# Patient Record
Sex: Male | Born: 1970 | Race: White | Hispanic: No | Marital: Married | Smoking: Never smoker
Health system: Southern US, Community
[De-identification: ages and names within clinical notes are randomized; demographics above are authoritative.]

---

## 2017-12-08 HISTORY — PX: PARTIAL COLECTOMY: SHX5273

## 2021-04-25 ENCOUNTER — Emergency Department (HOSPITAL_COMMUNITY): Payer: No Typology Code available for payment source

## 2021-04-25 ENCOUNTER — Inpatient Hospital Stay (HOSPITAL_COMMUNITY)
Admission: EM | Admit: 2021-04-25 | Discharge: 2021-04-28 | DRG: 394 | Disposition: A | Discharge status: Home / Self Care | Payer: No Typology Code available for payment source | Attending: Internal Medicine | Admitting: Internal Medicine

## 2021-04-25 ENCOUNTER — Other Ambulatory Visit: Payer: Self-pay

## 2021-04-25 ENCOUNTER — Encounter (HOSPITAL_COMMUNITY): Payer: Self-pay | Admitting: Emergency Medicine

## 2021-04-25 DIAGNOSIS — K529 Noninfective gastroenteritis and colitis, unspecified: Principal | ICD-10-CM | POA: Diagnosis present

## 2021-04-25 DIAGNOSIS — Z20822 Contact with and (suspected) exposure to covid-19: Secondary | ICD-10-CM | POA: Diagnosis present

## 2021-04-25 DIAGNOSIS — E871 Hypo-osmolality and hyponatremia: Secondary | ICD-10-CM | POA: Diagnosis present

## 2021-04-25 DIAGNOSIS — K760 Fatty (change of) liver, not elsewhere classified: Secondary | ICD-10-CM | POA: Diagnosis present

## 2021-04-25 DIAGNOSIS — Z9049 Acquired absence of other specified parts of digestive tract: Secondary | ICD-10-CM

## 2021-04-25 DIAGNOSIS — Z6828 Body mass index (BMI) 28.0-28.9, adult: Secondary | ICD-10-CM

## 2021-04-25 DIAGNOSIS — N289 Disorder of kidney and ureter, unspecified: Secondary | ICD-10-CM

## 2021-04-25 DIAGNOSIS — R7989 Other specified abnormal findings of blood chemistry: Secondary | ICD-10-CM | POA: Diagnosis present

## 2021-04-25 DIAGNOSIS — K559 Vascular disorder of intestine, unspecified: Secondary | ICD-10-CM | POA: Diagnosis not present

## 2021-04-25 DIAGNOSIS — E875 Hyperkalemia: Secondary | ICD-10-CM | POA: Diagnosis present

## 2021-04-25 DIAGNOSIS — E663 Overweight: Secondary | ICD-10-CM | POA: Diagnosis present

## 2021-04-25 DIAGNOSIS — N179 Acute kidney failure, unspecified: Secondary | ICD-10-CM | POA: Diagnosis present

## 2021-04-25 DIAGNOSIS — R7401 Elevation of levels of liver transaminase levels: Secondary | ICD-10-CM

## 2021-04-25 DIAGNOSIS — Z720 Tobacco use: Secondary | ICD-10-CM

## 2021-04-25 LAB — CBC WITH DIFFERENTIAL/PLATELET
Abs Immature Granulocytes: 0.05 10*3/uL (ref 0.00–0.07)
Basophils Absolute: 0 10*3/uL (ref 0.0–0.1)
Basophils Relative: 0 %
Eosinophils Absolute: 0 10*3/uL (ref 0.0–0.5)
Eosinophils Relative: 0 %
HCT: 49.1 % (ref 39.0–52.0)
Hemoglobin: 16.4 g/dL (ref 13.0–17.0)
Immature Granulocytes: 0 %
Lymphocytes Relative: 16 %
Lymphs Abs: 2 10*3/uL (ref 0.7–4.0)
MCH: 30.5 pg (ref 26.0–34.0)
MCHC: 33.4 g/dL (ref 30.0–36.0)
MCV: 91.3 fL (ref 80.0–100.0)
Monocytes Absolute: 1.1 10*3/uL — ABNORMAL HIGH (ref 0.1–1.0)
Monocytes Relative: 9 %
Neutro Abs: 8.9 10*3/uL — ABNORMAL HIGH (ref 1.7–7.7)
Neutrophils Relative %: 75 %
Platelets: 255 10*3/uL (ref 150–400)
RBC: 5.38 MIL/uL (ref 4.22–5.81)
RDW: 13.1 % (ref 11.5–15.5)
WBC: 12.1 10*3/uL — ABNORMAL HIGH (ref 4.0–10.5)
nRBC: 0 % (ref 0.0–0.2)

## 2021-04-25 LAB — URINALYSIS, ROUTINE W REFLEX MICROSCOPIC
Bacteria, UA: NONE SEEN
Bilirubin Urine: NEGATIVE
Glucose, UA: NEGATIVE mg/dL
Ketones, ur: 80 mg/dL — AB
Leukocytes,Ua: NEGATIVE
Nitrite: NEGATIVE
Protein, ur: NEGATIVE mg/dL
Specific Gravity, Urine: 1.021 (ref 1.005–1.030)
pH: 5 (ref 5.0–8.0)

## 2021-04-25 MED ORDER — ONDANSETRON HCL 4 MG/2ML IJ SOLN
4.0000 mg | Freq: Once | INTRAMUSCULAR | Status: AC
  Administered 2021-04-25: 4 mg via INTRAVENOUS
  Filled 2021-04-25: qty 2

## 2021-04-25 MED ORDER — FENTANYL CITRATE (PF) 100 MCG/2ML IJ SOLN
50.0000 ug | Freq: Once | INTRAMUSCULAR | Status: AC
Start: 2021-04-25 — End: 2021-04-25
  Administered 2021-04-25: 50 ug via INTRAVENOUS
  Filled 2021-04-25: qty 2

## 2021-04-25 NOTE — ED Provider Notes (Signed)
MSE was initiated and I personally evaluated the patient and placed orders (if any) at  10:38 PM on Apr 25, 2021.  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 Also reports passing BRB per rectum x 5 today. Nausea, no vomiting. He reports diaphoresis, ?fever. No urinary symptoms.   Today's Vitals   04/25/21 2227 04/25/21 2234  BP: (!) 155/102   Pulse: 78   Resp: 20   Temp: 98.5 F (36.9 C)   TempSrc: Oral   SpO2: 99%   PainSc:  8    There is no height or weight on file to calculate BMI.  Uncomfortable appearing Abdomen extremely tender distended  The patient appears stable so that the remainder of the MSE may be completed by another provider.   Elpidio Anis, PA-C 04/25/21 2243    Tegeler, Canary Brim, MD 04/26/21 [phone]

## 2021-04-25 NOTE — ED Triage Notes (Signed)
Patient reports hypogastric pain with nausea and diarrhea onset last night , no fever or chills .

## 2021-04-26 ENCOUNTER — Encounter (HOSPITAL_COMMUNITY): Payer: Self-pay | Admitting: Family Medicine

## 2021-04-26 DIAGNOSIS — E871 Hypo-osmolality and hyponatremia: Secondary | ICD-10-CM | POA: Diagnosis present

## 2021-04-26 DIAGNOSIS — K760 Fatty (change of) liver, not elsewhere classified: Secondary | ICD-10-CM | POA: Diagnosis present

## 2021-04-26 DIAGNOSIS — E875 Hyperkalemia: Secondary | ICD-10-CM

## 2021-04-26 DIAGNOSIS — R7989 Other specified abnormal findings of blood chemistry: Secondary | ICD-10-CM

## 2021-04-26 DIAGNOSIS — Z720 Tobacco use: Secondary | ICD-10-CM | POA: Diagnosis not present

## 2021-04-26 DIAGNOSIS — N179 Acute kidney failure, unspecified: Secondary | ICD-10-CM | POA: Diagnosis present

## 2021-04-26 DIAGNOSIS — Z20822 Contact with and (suspected) exposure to covid-19: Secondary | ICD-10-CM | POA: Diagnosis present

## 2021-04-26 DIAGNOSIS — Z6828 Body mass index (BMI) 28.0-28.9, adult: Secondary | ICD-10-CM | POA: Diagnosis not present

## 2021-04-26 DIAGNOSIS — K559 Vascular disorder of intestine, unspecified: Secondary | ICD-10-CM | POA: Diagnosis present

## 2021-04-26 DIAGNOSIS — K529 Noninfective gastroenteritis and colitis, unspecified: Secondary | ICD-10-CM | POA: Diagnosis present

## 2021-04-26 DIAGNOSIS — N289 Disorder of kidney and ureter, unspecified: Secondary | ICD-10-CM | POA: Diagnosis not present

## 2021-04-26 DIAGNOSIS — E663 Overweight: Secondary | ICD-10-CM | POA: Diagnosis present

## 2021-04-26 DIAGNOSIS — Z9049 Acquired absence of other specified parts of digestive tract: Secondary | ICD-10-CM | POA: Diagnosis not present

## 2021-04-26 LAB — COMPREHENSIVE METABOLIC PANEL
ALT: 93 U/L — ABNORMAL HIGH (ref 0–44)
ALT: 69 U/L — ABNORMAL HIGH (ref 0–44)
AST: 51 U/L — ABNORMAL HIGH (ref 15–41)
AST: 29 U/L (ref 15–41)
Albumin: 4.4 g/dL (ref 3.5–5.0)
Albumin: 3.5 g/dL (ref 3.5–5.0)
Alkaline Phosphatase: 61 U/L (ref 38–126)
Alkaline Phosphatase: 49 U/L (ref 38–126)
Anion gap: 11 (ref 5–15)
Anion gap: 6 (ref 5–15)
BUN: 12 mg/dL (ref 6–20)
BUN: 12 mg/dL (ref 6–20)
CO2: 25 mmol/L (ref 22–32)
CO2: 24 mmol/L (ref 22–32)
Calcium: 9.9 mg/dL (ref 8.9–10.3)
Calcium: 8.9 mg/dL (ref 8.9–10.3)
Chloride: 100 mmol/L (ref 98–111)
Chloride: 103 mmol/L (ref 98–111)
Creatinine, Ser: 1.54 mg/dL — ABNORMAL HIGH (ref 0.61–1.24)
Creatinine, Ser: 1.28 mg/dL — ABNORMAL HIGH (ref 0.61–1.24)
GFR, Estimated: 54 mL/min — ABNORMAL LOW (ref >60)
GFR, Estimated: >60 mL/min (ref >60)
Glucose, Bld: 108 mg/dL — ABNORMAL HIGH (ref 70–99)
Glucose, Bld: 94 mg/dL (ref 70–99)
Potassium: 5.6 mmol/L — ABNORMAL HIGH (ref 3.5–5.1)
Potassium: 4 mmol/L (ref 3.5–5.1)
Sodium: 136 mmol/L (ref 135–145)
Sodium: 133 mmol/L — ABNORMAL LOW (ref 135–145)
Total Bilirubin: 1.5 mg/dL — ABNORMAL HIGH (ref 0.3–1.2)
Total Bilirubin: 0.9 mg/dL (ref 0.3–1.2)
Total Protein: 8 g/dL (ref 6.5–8.1)
Total Protein: 6.4 g/dL — ABNORMAL LOW (ref 6.5–8.1)

## 2021-04-26 LAB — CBC
HCT: 41.9 % (ref 39.0–52.0)
HCT: 41.6 % (ref 39.0–52.0)
HCT: 42 % (ref 39.0–52.0)
Hemoglobin: 14 g/dL (ref 13.0–17.0)
Hemoglobin: 13.9 g/dL (ref 13.0–17.0)
Hemoglobin: 13.9 g/dL (ref 13.0–17.0)
MCH: 30.9 pg (ref 26.0–34.0)
MCH: 30.6 pg (ref 26.0–34.0)
MCH: 30.4 pg (ref 26.0–34.0)
MCHC: 33.4 g/dL (ref 30.0–36.0)
MCHC: 33.4 g/dL (ref 30.0–36.0)
MCHC: 33.1 g/dL (ref 30.0–36.0)
MCV: 92.5 fL (ref 80.0–100.0)
MCV: 91.6 fL (ref 80.0–100.0)
MCV: 91.9 fL (ref 80.0–100.0)
Platelets: 204 10*3/uL (ref 150–400)
Platelets: 189 10*3/uL (ref 150–400)
Platelets: 204 10*3/uL (ref 150–400)
RBC: 4.53 MIL/uL (ref 4.22–5.81)
RBC: 4.54 MIL/uL (ref 4.22–5.81)
RBC: 4.57 MIL/uL (ref 4.22–5.81)
RDW: 13.2 % (ref 11.5–15.5)
RDW: 13.2 % (ref 11.5–15.5)
RDW: 13.1 % (ref 11.5–15.5)
WBC: 8.8 10*3/uL (ref 4.0–10.5)
WBC: 8.6 10*3/uL (ref 4.0–10.5)
WBC: 9 10*3/uL (ref 4.0–10.5)
nRBC: 0 % (ref 0.0–0.2)
nRBC: 0 % (ref 0.0–0.2)
nRBC: 0 % (ref 0.0–0.2)

## 2021-04-26 LAB — LACTIC ACID, PLASMA: Lactic Acid, Venous: 1.6 mmol/L (ref 0.5–1.9)

## 2021-04-26 LAB — C-REACTIVE PROTEIN: CRP: 5.7 mg/dL — ABNORMAL HIGH (ref <1.0)

## 2021-04-26 LAB — SARS CORONAVIRUS 2 (TAT 6-24 HRS): SARS Coronavirus 2: NEGATIVE

## 2021-04-26 LAB — HIV ANTIBODY (ROUTINE TESTING W REFLEX): HIV Screen 4th Generation wRfx: NONREACTIVE

## 2021-04-26 LAB — TYPE AND SCREEN
ABO/RH(D): O NEG
Antibody Screen: NEGATIVE

## 2021-04-26 LAB — SEDIMENTATION RATE: Sed Rate: 4 mm/hr (ref 0–16)

## 2021-04-26 LAB — LIPASE, BLOOD: Lipase: 28 U/L (ref 11–51)

## 2021-04-26 LAB — ABO/RH: ABO/RH(D): O NEG

## 2021-04-26 MED ORDER — METRONIDAZOLE 500 MG/100ML IV SOLN
500.0000 mg | Freq: Once | INTRAVENOUS | Status: AC
  Administered 2021-04-26: 500 mg via INTRAVENOUS
  Filled 2021-04-26: qty 100

## 2021-04-26 MED ORDER — CIPROFLOXACIN IN D5W 400 MG/200ML IV SOLN
400.0000 mg | Freq: Once | INTRAVENOUS | Status: AC
  Administered 2021-04-26: 400 mg via INTRAVENOUS
  Filled 2021-04-26: qty 200

## 2021-04-26 MED ORDER — CIPROFLOXACIN IN D5W 400 MG/200ML IV SOLN
400.0000 mg | Freq: Two times a day (BID) | INTRAVENOUS | Status: DC
  Administered 2021-04-26 – 2021-04-28 (×5): 400 mg via INTRAVENOUS
  Filled 2021-04-26 (×6): qty 200

## 2021-04-26 MED ORDER — OXYCODONE HCL 5 MG PO TABS
5.0000 mg | ORAL_TABLET | Freq: Four times a day (QID) | ORAL | Status: DC | PRN
Start: 2021-04-26 — End: 2021-04-28
  Administered 2021-04-26 – 2021-04-28 (×3): 5 mg via ORAL
  Filled 2021-04-26 (×4): qty 1

## 2021-04-26 MED ORDER — ONDANSETRON HCL 4 MG PO TABS: 4.0000 mg | ORAL_TABLET | Freq: Four times a day (QID) | ORAL | Status: DC | PRN

## 2021-04-26 MED ORDER — HYDROMORPHONE HCL 1 MG/ML IJ SOLN
0.5000 mg | INTRAMUSCULAR | Status: DC | PRN
Start: 2021-04-26 — End: 2021-04-28
  Administered 2021-04-26 – 2021-04-27 (×5): 1 mg via INTRAVENOUS
  Filled 2021-04-26 (×7): qty 1

## 2021-04-26 MED ORDER — ZOLPIDEM TARTRATE 5 MG PO TABS
5.0000 mg | ORAL_TABLET | Freq: Every evening | ORAL | Status: DC | PRN
  Administered 2021-04-26 – 2021-04-27 (×3): 5 mg via ORAL
  Filled 2021-04-26 (×3): qty 1

## 2021-04-26 MED ORDER — MORPHINE SULFATE (PF) 4 MG/ML IV SOLN
4.0000 mg | Freq: Once | INTRAVENOUS | Status: AC
  Administered 2021-04-26: 4 mg via INTRAVENOUS
  Filled 2021-04-26: qty 1

## 2021-04-26 MED ORDER — METRONIDAZOLE 500 MG/100ML IV SOLN
500.0000 mg | Freq: Three times a day (TID) | INTRAVENOUS | Status: DC
  Administered 2021-04-26 – 2021-04-28 (×7): 500 mg via INTRAVENOUS
  Filled 2021-04-26 (×7): qty 100

## 2021-04-26 MED ORDER — LACTATED RINGERS IV SOLN: INTRAVENOUS | Status: DC

## 2021-04-26 MED ORDER — ACETAMINOPHEN 650 MG RE SUPP: 650.0000 mg | Freq: Four times a day (QID) | RECTAL | Status: DC | PRN

## 2021-04-26 MED ORDER — ONDANSETRON HCL 4 MG/2ML IJ SOLN
4.0000 mg | Freq: Four times a day (QID) | INTRAMUSCULAR | Status: DC | PRN
  Administered 2021-04-26 – 2021-04-27 (×3): 4 mg via INTRAVENOUS
  Filled 2021-04-26 (×3): qty 2

## 2021-04-26 MED ORDER — HYDROMORPHONE HCL 1 MG/ML IJ SOLN
0.5000 mg | INTRAMUSCULAR | Status: DC | PRN
  Administered 2021-04-26 (×4): 1 mg via INTRAVENOUS
  Filled 2021-04-26 (×4): qty 1

## 2021-04-26 MED ORDER — ACETAMINOPHEN 325 MG PO TABS
650.0000 mg | ORAL_TABLET | Freq: Four times a day (QID) | ORAL | Status: DC | PRN
  Administered 2021-04-26: 650 mg via ORAL
  Filled 2021-04-26: qty 2

## 2021-04-26 MED ORDER — LACTATED RINGERS IV BOLUS
1000.0000 mL | Freq: Once | INTRAVENOUS | Status: AC
  Administered 2021-04-26: 1000 mL via INTRAVENOUS

## 2021-04-26 NOTE — ED Provider Notes (Signed)
[facility] EMERGENCY DEPARTMENT Provider Note   CSN: [id number] Arrival date & time: 04/25/21  2221     History Chief Complaint  Patient presents with  . Abdominal Pain    Diarrhea     Nicholas Ayers is a 50 y.o. male.  The history is provided by the patient.  Abdominal Pain He was awakened last night at about 1:30 AM by severe pain across the lower abdomen.  He felt like he had to have a bowel movement and went to the bathroom but got very lightheaded and nearly passed out.  He states that he was sweaty.  There was some mild nausea.  He later did have some diarrhea.  Through the day today, he is continued to have severe pain and has had 4 more episodes of diarrhea.  Each of those included passing moderate amount of blood.  He denies fever.  He did have some mild chills.  He denies any sick contacts.   History reviewed. No pertinent past medical history.  There are no problems to display for this patient.   History reviewed. No pertinent surgical history.     No family history on file.  Social History   Tobacco Use  . Smoking status: Never Smoker  . Smokeless tobacco: Never Used  Substance Use Topics  . Alcohol use: Never  . Drug use: Never    Home Medications Prior to Admission medications   Not on File    Allergies    Patient has no known allergies.  Review of Systems   Review of Systems  Gastrointestinal: Positive for abdominal pain.  All other systems reviewed and are negative.   Physical Exam Updated Vital Signs BP (!) 143/91 (BP Location: Right Arm)   Pulse 61   Temp 98.5 F (36.9 C) (Oral)   Resp 10   Ht 6\' 2"  (1.88 m)   Wt 99.8 kg   SpO2 99%   BMI 28.25 kg/m   Physical Exam Vitals and nursing note reviewed.   50 year old male, resting comfortably and in no acute distress. Vital signs are significant for elevated blood pressure. Oxygen saturation is 99%, which is normal. Head is normocephalic and atraumatic.  PERRLA, EOMI. Oropharynx is clear. Neck is nontender and supple without adenopathy or JVD. Back is nontender and there is no CVA tenderness. Lungs are clear without rales, wheezes, or rhonchi. Chest is nontender. Heart has regular rate and rhythm without murmur. Abdomen is soft, flat, with moderate tenderness to palpation in the suprapubic area and left lower quadrant.  There is no rebound or guarding.  There are no masses or hepatosplenomegaly and peristalsis is normoactive. Extremities have no cyanosis or edema, full range of motion is present. Skin is warm and dry without rash. Neurologic: Mental status is normal, cranial nerves are intact, there are no motor or sensory deficits.  ED Results / Procedures / Treatments   Labs (all labs ordered are listed, but only abnormal results are displayed) Labs Reviewed  CBC WITH DIFFERENTIAL/PLATELET - Abnormal; Notable for the following components:      Result Value   WBC 12.1 (*)    Neutro Abs 8.9 (*)    Monocytes Absolute 1.1 (*)    All other components within normal limits  COMPREHENSIVE METABOLIC PANEL - Abnormal; Notable for the following components:   Potassium 5.6 (*)    Glucose, Bld 108 (*)    Creatinine, Ser 1.54 (*)    AST 51 (*)    ALT  93 (*)    Total Bilirubin 1.5 (*)    GFR, Estimated 54 (*)    All other components within normal limits  URINALYSIS, ROUTINE W REFLEX MICROSCOPIC - Abnormal; Notable for the following components:   APPearance HAZY (*)    Hgb urine dipstick SMALL (*)    Ketones, ur 80 (*)    All other components within normal limits  COMPREHENSIVE METABOLIC PANEL - Abnormal; Notable for the following components:   Sodium 133 (*)    Creatinine, Ser 1.28 (*)    Total Protein 6.4 (*)    ALT 69 (*)    All other components within normal limits  C-REACTIVE PROTEIN - Abnormal; Notable for the following components:   CRP 5.7 (*)    All other components within normal limits  SARS CORONAVIRUS 2 (TAT 6-24 HRS)   GASTROINTESTINAL PANEL BY PCR, STOOL (REPLACES STOOL CULTURE)  LACTIC ACID, PLASMA  LIPASE, BLOOD  CBC  HIV ANTIBODY (ROUTINE TESTING W REFLEX)  CBC  CBC  SEDIMENTATION RATE  TYPE AND SCREEN  ABO/RH    EKG None  Radiology CT ABDOMEN PELVIS WO CONTRAST  Result Date: 04/25/2021 CLINICAL DATA:  Hypogastric pain with nausea and diarrhea, no fevers or chills EXAM: CT ABDOMEN AND PELVIS WITHOUT CONTRAST TECHNIQUE: Multidetector CT imaging of the abdomen and pelvis was performed following the standard protocol without IV contrast. COMPARISON:  None. FINDINGS: Lower chest: Lung bases are clear. Normal heart size. No pericardial effusion. Hepatobiliary: Diffuse hepatic hypoattenuation compatible with hepatic steatosis. Sparing along the gallbladder fossa. Smooth surface contour. No concerning focal liver lesion is seen. Gallbladder distention within normal limits. No pericholecystic fluid or inflammation. No visible calcified gallstones. No biliary ductal dilatation or intraductal gallstones. Pancreas: Partial fatty replacement of the pancreas. No pancreatic ductal dilatation or surrounding inflammatory changes. Spleen: Normal in size. No concerning splenic lesions. Adrenals/Urinary Tract: Normal adrenal glands. 3.9 cm fluid attenuation cyst in the upper pole left kidney. No concerning renal lesion is visible. No urolithiasis or hydronephrosis. Urinary bladder is largely decompressed at the time of exam and therefore poorly evaluated by CT imaging. Bladder wall thickening may be related to underdistention. Stomach/Bowel: Distal esophagus, stomach and duodenal sweep are unremarkable. No small bowel wall thickening or dilatation. No evidence of obstruction. Appendix is not visualized. No focal inflammation the vicinity of the cecum to suggest an occult appendicitis. Segmental thickening of the colon from the splenic flexure is through the mid descending colonic segment returning to a more normal caliber  just proximal to the rectosigmoid anastomosis in the midline pelvis. This anastomotic site appears widely patent. Scattered colonic diverticula without focal diverticular inflammation to suggest diverticulitis as the underlying etiology. Vascular/Lymphatic: No significant vascular findings are present. No enlarged abdominal or pelvic lymph nodes. Reproductive: The prostate and seminal vesicles are unremarkable. Other: Some hazy pericolonic stranding, surrounding the thickened segments splenic flexure and descending colon as above. No free air. No organized abscess or collection. No pneumatosis or portal venous gas. No air within the draining mesentery. Musculoskeletal: No acute osseous abnormality or suspicious osseous lesion. Multilevel degenerative changes are present in the imaged portions of the spine. Mild retrolisthesis L4 on 5 approximately 4 mm. No associated spondylolysis. Discogenic changes are maximal at this level. Additional mild degenerative changes in the bilateral hips and pelvis. 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 No acute or worrisome osseous abnormalities. IMPRESSION: 1. Segmental thickening of the splenic flexure and descending colon, favor acute colitis of infectious or inflammatory etiology. 2.  Colonic diverticulosis without focal inflammation to suggest active diverticulitis at this time. 3. Postsurgical changes from prior rectosigmoid anastomosis which appears widely patent without acute complication. Correlate with surgical history. 4. Hepatic steatosis. Electronically Signed   By: Kreg Shropshire M.D.   On: 04/25/2021 23:13    Procedures Procedures   Medications Ordered in ED Medications  metroNIDAZOLE (FLAGYL) IVPB 500 mg (has no administration in time range)  ciprofloxacin (CIPRO) IVPB 400 mg (has no administration in time range)  morphine 4 MG/ML injection 4 mg (has no administration in time range)  lactated ringers bolus 1,000 mL (has no administration in time range)   fentaNYL (SUBLIMAZE) injection 50 mcg (50 mcg Intravenous Given 04/25/21 2251)  ondansetron (ZOFRAN) injection 4 mg (4 mg Intravenous Given 04/25/21 2310)    ED Course  I have reviewed the triage vital signs and the nursing notes.  Pertinent labs & imaging results that were available during my care of the patient were reviewed by me and considered in my medical decision making (see chart for details).   MDM Rules/Calculators/A&P                         Abdominal pain and diarrhea with blood in the stool.  Etiologies include diverticulitis, colitis.  Labs do show mild renal insufficiency but normal hemoglobin.  Mild elevation of transaminases is also noted of uncertain clinical significance.  No prior labs are available in our system or in care everywhere.  CT scan shows evidence of colitis, probably infectious.  He started on antibiotics of ciprofloxacin and metronidazole.  In light of and known prior renal status, CT evidence of colitis, bloody stools, it is elected to admit the patient for initial hydration and IV antibiotics.  Old records reviewed, and he has no relevant past visits.  Case is discussed with Dr. Antionette Char of [facility], who agrees to admit the patient.  Final Clinical Impression(s) / ED Diagnoses Final diagnoses:  Colitis  Renal insufficiency  Elevated transaminase level    Rx / DC Orders ED Discharge Orders    None       Dione Booze, MD 04/26/21 [phone]

## 2021-04-26 NOTE — H&P (Addendum)
History and Physical    [name] ZOX:[id number] DOB: 22-May-1971 DOA: 04/25/2021  [facility]   Patient coming from: Home   Chief Complaint: Abdominal pain, rectal bleeding, diaphoresis, loose stools   HPI: Nicholas Ayers is a 50 y.o. 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  Patient reports that he was in his usual state until he woke up the night of 04/24/2021 with severe pain on the left side of his abdomen.  This was associated with nausea but no vomiting, and roughly 5 loose stools that contained some dark red blood.  He had diaphoresis associated with this that he attributes to pain.  Reports that in 2019 when he had a fistula, he was under the impression that this was secondary to a chemical exposure while in the [employer] and that he would not have any further issues after the resection.   ED Course: Upon arrival to the ED, patient is found to be afebrile, saturating well on room air, and with blood pressure 155/102.  Chemistry panel notable for potassium 5.6, creatinine 1.54, AST 51, ALT 93, and bilirubin 1.5.  CBC features a leukocytosis to 12,100.  Lactic acid is normal.  Lipase was normal.  Urinalysis with ketonuria.  CT the abdomen and pelvis demonstrates segmental thickening of the splenic flexure and descending colon, hepatic steatosis, and no hydronephrosis.  Patient was given a liter of LR, ciprofloxacin, Flagyl, morphine, fentanyl, and Zofran in the ED.  Review of Systems:  All other systems reviewed and apart from HPI, are negative.  History reviewed. No pertinent past medical history.  Past Surgical History:  Procedure Laterality Date  . PARTIAL COLECTOMY  2019    Social History:   reports that he has never smoked. He uses  smokeless tobacco. He reports current alcohol use. He reports that he does not use drugs.  No Known Allergies  Family History  Problem Relation Age of Onset  . Diabetes Neg Hx   . Bowel Disease Neg Hx      Prior to Admission medications   Not on File    Physical Exam: Vitals:   04/26/21 0115 04/26/21 0130 04/26/21 0145 04/26/21 0200  BP: (!) 131/92 (!) 135/94 (!) 146/91 136/89  Pulse: 64 61 67 60  Resp: '14 13 13 10  ' Temp:      TempSrc:      SpO2: 98% 95% 100% 96%  Weight:      Height:        Constitutional: NAD, calm  Eyes: PERTLA, lids and conjunctivae normal ENMT: Mucous membranes are moist. Posterior pharynx clear of any exudate or lesions.   Neck: supple, no masses  Respiratory:  no wheezing, no crackles. No accessory muscle use.  Cardiovascular: S1 & S2 heard, regular rate and rhythm. No extremity edema.   Abdomen: soft, tender on the left without guarding. Bowel sounds active.  Musculoskeletal: no clubbing / cyanosis. No joint deformity upper and lower extremities.   Skin: no significant rashes, lesions, ulcers. Warm, dry, well-perfused. Neurologic: CN 2-12 grossly intact. Sensation intact. Moving all extremities.  Psychiatric: Alert and oriented to person, place, and situation. Calm and cooperative.    Labs and Imaging on Admission: I have personally reviewed following labs and imaging studies  CBC: Recent Labs  Lab 04/25/21 2237  WBC 12.1*  NEUTROABS 8.9*  HGB 16.4  HCT 49.1  MCV 91.3  PLT 564   Basic Metabolic Panel: Recent Labs  Lab 04/25/21 2237  NA 136  K 5.6*  CL 100  CO2 25  GLUCOSE 108*  BUN 12  CREATININE 1.54*  CALCIUM 9.9   GFR: Estimated Creatinine Clearance: 71.6 mL/min (A) (by C-G formula based on SCr of 1.54 mg/dL (H)). Liver Function Tests: Recent Labs  Lab 04/25/21 2237  AST 51*  ALT 93*  ALKPHOS 61  BILITOT 1.5*  PROT 8.0  ALBUMIN 4.4   Recent Labs  Lab 04/25/21 2237  LIPASE 28   No results for input(s):  AMMONIA in the last 168 hours. Coagulation Profile: No results for input(s): INR, PROTIME in the last 168 hours. Cardiac Enzymes: No results for input(s): CKTOTAL, CKMB, CKMBINDEX, TROPONINI in the last 168 hours. BNP (last 3 results) No results for input(s): PROBNP in the last 8760 hours. HbA1C: No results for input(s): HGBA1C in the last 72 hours. CBG: No results for input(s): GLUCAP in the last 168 hours. Lipid Profile: No results for input(s): CHOL, HDL, LDLCALC, TRIG, CHOLHDL, LDLDIRECT in the last 72 hours. Thyroid Function Tests: No results for input(s): TSH, T4TOTAL, FREET4, T3FREE, THYROIDAB in the last 72 hours. Anemia Panel: No results for input(s): VITAMINB12, FOLATE, FERRITIN, TIBC, IRON, RETICCTPCT in the last 72 hours. Urine analysis:    Component Value Date/Time   COLORURINE YELLOW 04/25/2021 2251   APPEARANCEUR HAZY (A) 04/25/2021 2251   LABSPEC 1.021 04/25/2021 2251   PHURINE 5.0 04/25/2021 2251   GLUCOSEU NEGATIVE 04/25/2021 2251   HGBUR SMALL (A) 04/25/2021 2251   BILIRUBINUR NEGATIVE 04/25/2021 2251   KETONESUR 80 (A) 04/25/2021 2251   PROTEINUR NEGATIVE 04/25/2021 2251   NITRITE NEGATIVE 04/25/2021 2251   LEUKOCYTESUR NEGATIVE 04/25/2021 2251   Sepsis Labs: '@LABRCNTIP' (procalcitonin:4,lacticidven:4) )No results found for this or any previous visit (from the past 240 hour(s)).   Radiological Exams on Admission: CT ABDOMEN PELVIS WO CONTRAST  Result Date: 04/25/2021 CLINICAL DATA:  Hypogastric pain with nausea and diarrhea, no fevers or chills EXAM: CT ABDOMEN AND PELVIS WITHOUT CONTRAST TECHNIQUE: Multidetector CT imaging of the abdomen and pelvis was performed following the standard protocol without IV contrast. COMPARISON:  None. FINDINGS: Lower chest: Lung bases are clear. Normal heart size. No pericardial effusion. Hepatobiliary: Diffuse hepatic hypoattenuation compatible with hepatic steatosis. Sparing along the gallbladder fossa. Smooth surface  contour. No concerning focal liver lesion is seen. Gallbladder distention within normal limits. No pericholecystic fluid or inflammation. No visible calcified gallstones. No biliary ductal dilatation or intraductal gallstones. Pancreas: Partial fatty replacement of the pancreas. No pancreatic ductal dilatation or surrounding inflammatory changes. Spleen: Normal in size. No concerning splenic lesions. Adrenals/Urinary Tract: Normal adrenal glands. 3.9 cm fluid attenuation cyst in the upper pole left kidney. No concerning renal lesion is visible. No urolithiasis or hydronephrosis. Urinary bladder is largely decompressed at the time of exam and therefore poorly evaluated by CT imaging. Bladder wall thickening may be related to underdistention. Stomach/Bowel: Distal esophagus, stomach and duodenal sweep are unremarkable. No small bowel wall thickening or dilatation. No evidence of obstruction. Appendix is not visualized. No focal inflammation the vicinity of the cecum to suggest an occult appendicitis. Segmental thickening of the colon from the splenic flexure is through the mid descending colonic segment returning to a more normal caliber just proximal to the rectosigmoid anastomosis in the midline pelvis. This anastomotic site appears widely patent. Scattered colonic diverticula without focal diverticular inflammation to suggest diverticulitis as the underlying  etiology. Vascular/Lymphatic: No significant vascular findings are present. No enlarged abdominal or pelvic lymph nodes. Reproductive: The prostate and seminal vesicles are unremarkable. Other: Some hazy pericolonic stranding, surrounding the thickened segments splenic flexure and descending colon as above. No free air. No organized abscess or collection. No pneumatosis or portal venous gas. No air within the draining mesentery. Musculoskeletal: No acute osseous abnormality or suspicious osseous lesion. Multilevel degenerative changes are present in the imaged  portions of the spine. Mild retrolisthesis L4 on 5 approximately 4 mm. No associated spondylolysis. Discogenic changes are maximal at this level. Additional mild degenerative changes in the bilateral hips and pelvis. 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 No acute or worrisome osseous abnormalities. IMPRESSION: 1. Segmental thickening of the splenic flexure and descending colon, favor acute colitis of infectious or inflammatory etiology. 2. Colonic diverticulosis without focal inflammation to suggest active diverticulitis at this time. 3. Postsurgical changes from prior rectosigmoid anastomosis which appears widely patent without acute complication. Correlate with surgical history. 4. Hepatic steatosis. Electronically Signed   By: Lovena Le M.D.   On: 04/25/2021 23:13    Assessment/Plan   1. Acute colitis  - Presents with 1 day of severe left-sided abdominal pain with bloody diarrhea and is found on CT to have thickening of splenic flexure and descending colon concerning for infectious or inflammatory colitis  - Started on antibiotics in ED  - Check GI pathogen panel, ESR, and CRP, type and screen and follow serial H&H, continue empiric antibiotics, pain-control, and IVF hydration    2. Renal insufficiency; hyperkalemia  - SCr is 1.54 and potassium 5.6 on admission  - Patient denies hx of CKD  - No hydronephrosis on CT  - Likely prerenal azotemia in setting of loose stools  - Continue IVF hydration, repeat chemistries   3. Elevated LFTs  - Mild elevation in transaminases and total bilirubin noted on admission without RUQ pain  - Hepatic steatosis on CT in ED  - Trend     DVT prophylaxis: SCDs   Code Status: Full  Level of Care: Level of care: Telemetry Medical Family Communication: Significant other updated at bedside Disposition Plan:  Patient is from: home  Anticipated d/c is to: Home  Anticipated d/c date is: 04/28/21 Patient currently: Pending improvement in pain, tolerance of oral  diet, stability of blood counts, stool culture Consults called: none  Admission status: Inpatient     Vianne Bulls, MD [facility]  04/26/2021, 2:38 AM

## 2021-04-26 NOTE — Progress Notes (Signed)
Care started prior to midnight in the emergency room and patient was admitted early this morning after midnight by Dr. Christia Reading Opyd and I am in current agreement with his assessment and plan.  Additional changes to the plan of care have been made accordingly.  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  Patient states that he was in his normal state of health until he woke up with a HIDA 04/24/2021 severe pain in left side of his abdomen.  This was associated with nausea but no vomiting, roughly 5 stools that contained some blood that was dark red.  He also had diaphoresis associated with this and he attributed this to pain.  He reported in 2019 when he had a fistula that he was on impression that this was a secondary to chemical exposure while in [employer] and that he would not have any further issues after his resection.  He presented to the ED and was found to be afebrile saturating well on room air with a blood pressure 155/102.  He did have a leukocytosis of 12,000 and urinalysis with ketonuria.  CT of the abdomen pelvis was done and showed segmental thickening of the splenic flexure and descending colon, hepatic steatosis, and no hydronephrosis.  In the ED he was given a liter of LR, ciprofloxacin and Flagyl, morphine, fentanyl and Zofran.  Currently he is being admitted and treated for the following but not limited too:  Acute Colitis and likely Ischemic Colitis   -Presents with 1 day of severe left-sided abdominal pain with bloody diarrhea and is found on CT to have thickening of splenic flexure and descending colon concerning for infectious or inflammatory colitis  -Started on antibiotics in ED  with Cipro  Flagyl and will continue for now -Check GI pathogen panel, ESR, and CRP, type and screen and follow serial H&H,  -Continue with pain-control with IV Hydromorphone 0.5 to 1 mg IV every 4 as needed for moderate to severe pain and acetaminophen 650 mg p.o. every 6 as needed for mild pain and fever, and IVF hydration as below -WBC went from 12.1 -> 8.8 -CRP was 5.7 -Continue with clear liquid diet and will advance past this for today and see how the patient doing in the morning -Continue with antiemetics with ondansetron 4 mg p.o./IV every 6 as needed  -We will discuss with GI about possible further evaluation and work-up and they feel no need to see the patient currently at this point and they feel that his symptoms are consistent with ischemic colitis and that if he is not better by Sunday they will be happy to formally consult -Continue with supportive care  AKI  Hyperkalemia  -SCr is 1.54 and potassium 5.6 on admission  -Patient denies hx of CKD  -No hydronephrosis on CT  -Likely prerenal azotemia in setting of loose stools  -Patient's BUN/Cr went from 12/1.54 -> 12/1.28 -Avoid nephrotoxic medications, contrast dyes, hypotension and renally adjust medications -Continue IVF hydration 125 mL/h for 10 hours and then reduce rate to 75 MLS per hour,  -Repeat CMP in the AM   Hyponatremia -Mild -Patient's sodium went from 136 is now 133 -Continue with IV fluid hydration with lactated Ringer's at 125 mils per hour being  reduced to 75 mL/hr but may need to change normal saline  -Repeat CMP in a.m.  Abnormal LFT's/Elevated LFTs  Hyperbilirubinemia -Mild elevation in transaminases and total bilirubin noted on admission without RUQ pain  -Hepatic steatosis on CT in ED  -AST has improved from 51 -> 29 and ALT has improved from 93 -> 69 -Continue to Monitor and Trend and if Necessary will need to obtain RUQ U/S and Acute Hepatitis Panel -Continue to Monitor and Trend Hepatic Fxn Panel carefully  and repeat CMP in the AM   We will continue to monitor the patient's clinical response to intervention and repeat blood work in the a.m. and if he is not improving we will formally consult GI

## 2021-04-27 LAB — CBC
HCT: 40.2 % (ref 39.0–52.0)
HCT: 39.8 % (ref 39.0–52.0)
Hemoglobin: 13.6 g/dL (ref 13.0–17.0)
Hemoglobin: 13.3 g/dL (ref 13.0–17.0)
MCH: 31 pg (ref 26.0–34.0)
MCH: 30.3 pg (ref 26.0–34.0)
MCHC: 33.8 g/dL (ref 30.0–36.0)
MCHC: 33.4 g/dL (ref 30.0–36.0)
MCV: 91.6 fL (ref 80.0–100.0)
MCV: 90.7 fL (ref 80.0–100.0)
Platelets: 206 10*3/uL (ref 150–400)
Platelets: 201 10*3/uL (ref 150–400)
RBC: 4.39 MIL/uL (ref 4.22–5.81)
RBC: 4.39 MIL/uL (ref 4.22–5.81)
RDW: 13 % (ref 11.5–15.5)
RDW: 12.9 % (ref 11.5–15.5)
WBC: 8.4 10*3/uL (ref 4.0–10.5)
WBC: 7.2 10*3/uL (ref 4.0–10.5)
nRBC: 0 % (ref 0.0–0.2)
nRBC: 0 % (ref 0.0–0.2)

## 2021-04-27 LAB — COMPREHENSIVE METABOLIC PANEL
ALT: 54 U/L — ABNORMAL HIGH (ref 0–44)
AST: 25 U/L (ref 15–41)
Albumin: 3.3 g/dL — ABNORMAL LOW (ref 3.5–5.0)
Alkaline Phosphatase: 42 U/L (ref 38–126)
Anion gap: 6 (ref 5–15)
BUN: 7 mg/dL (ref 6–20)
CO2: 28 mmol/L (ref 22–32)
Calcium: 8.9 mg/dL (ref 8.9–10.3)
Chloride: 101 mmol/L (ref 98–111)
Creatinine, Ser: 1.47 mg/dL — ABNORMAL HIGH (ref 0.61–1.24)
GFR, Estimated: 57 mL/min — ABNORMAL LOW (ref >60)
Glucose, Bld: 96 mg/dL (ref 70–99)
Potassium: 4.6 mmol/L (ref 3.5–5.1)
Sodium: 135 mmol/L (ref 135–145)
Total Bilirubin: 0.8 mg/dL (ref 0.3–1.2)
Total Protein: 6.4 g/dL — ABNORMAL LOW (ref 6.5–8.1)

## 2021-04-27 MED ORDER — SODIUM CHLORIDE 0.9 % IV SOLN: INTRAVENOUS | Status: DC

## 2021-04-27 NOTE — Progress Notes (Signed)
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  Patient states that he was in his normal state of health until he woke up with a HIDA 04/24/2021 severe pain in left side of his abdomen.  This was associated with nausea but no vomiting, roughly 5 stools that contained some blood that was dark red.  He also had diaphoresis associated with this and he attributed this to pain.  He reported in 2019 when he had a fistula that he was on impression that this was a secondary to chemical exposure while in [employer] and that he would not have any further issues after his resection.  He presented to the ED and was found to be afebrile saturating well on room air with a blood pressure 155/102.  He did have a leukocytosis of 12,000 and urinalysis with ketonuria.  CT of the abdomen pelvis was done and showed segmental thickening of the splenic flexure and descending colon, hepatic steatosis, and no hydronephrosis.  In the ED he was given a liter of LR, ciprofloxacin and Flagyl, morphine, fentanyl and Zofran.  **Interim History Had a bloody bowel movement yesterday however thinks it is improving.  He has had no further bowel movements currently.  Abdominal pain is still there but is improving.  He remains on antibiotics and IV fluid hydration and diet has been advanced to a full liquid diet and will anticipate going to soft tomorrow.  Assessment & Plan:   Principal Problem:   Acute hemorrhagic colitis Active Problems:   Renal insufficiency   Elevated LFTs   Hyperkalemia  Acute Colitis and  likely Ischemic Colitis  -Presents with 1 day of severe left-sided abdominal pain with bloody diarrhea and is found on CT to have thickening of splenic flexure and descending colon concerning for infectious or inflammatory colitis  -Started on antibiotics in ED with Cipro Flagyl and will continue for now -Check GI pathogen panel, ESR, and CRP,type and screen and follow serial H&H, -Continue with pain-control with IV Hydromorphone 0.5 to 1 mg IV every 4 as needed for moderate to severe pain and acetaminophen 650 mg p.o. every 6 as needed for mild pain and fever,andIVF hydration as below -WBC went from 12.1 -> 8.8 -> 8.6 -> 9.0 -> 8.4 -CRP was 5.7 and ESR was 4 -CLD advanced to FULL Liquid and will go to Soft in the AM  -Continue with antiemetics with ondansetron 4 mg p.o./IV every 6 as needed -We will discuss with GI about possible further evaluation and work-up and they feel no need to see the patient currently at this point and they feel that his symptoms are consistent with ischemic colitis and that if he is not better by Sunday they will be happy to formally consult -Continue with supportive care  AKI  Hyperkalemia -SCr is 1.54 and potassium 5.6 on admission -Patient denies hx of CKD -No hydronephrosis on CT -Likely prerenal azotemia in setting of loose stools -Patient's BUN/Cr went from 12/1.54 -> 12/1.28 -> 7/1.47  -Urinalysis done and showed a hazy appearance with small hemoglobin, 80 ketones, negative leukocytes, negative nitrites, no bacteria seen, 0-5 squamous  epithelial cells, 0-5 RBCs per high-power field, as well as 0-5 WBCs -Avoid nephrotoxic medications, contrast dyes, hypotension and renally adjust medications -Continue IVF hydration 125 mL/h for 10 hours and then reduce rate to 75 MLS per hour but will will increase back to 100 mL/hr and change to NS  -Repeat CMP in the AM  Hyponatremia -Mild -Patient's sodium went from 136 -> 133 -> 135 -Continue with IV  fluid hydration with lactated Ringer's at 125 mils per hour being reduced to 75 mL/hr but have now changed to NS at 100 mL/hr -Repeat CMP in a.m.  Abnormal LFT's/Elevated LFTs, improving Hyperbilirubinemia, improved -Mild elevation in transaminases and total bilirubin noted on admission without RUQ pain -Hepatic steatosis on CT in ED -T Bili is now 0.8 -AST has improved from 51 -> 29 and is now 25 and ALT has improved from 93 -> 69 and is now 54 -Continue to Monitor and Trend and if Necessary will need to obtain RUQ U/S and Acute Hepatitis Panel -Continue to Monitor and Trend Hepatic Fxn Panel carefully and repeat CMP in the AM   DVT prophylaxis: SCDs Code Status: FULL CODE  Family Communication: Spoke to girlfriend/family at bedside  Disposition Plan: Pending further clinical improvement back to baseline and tolerance of Diet   Status is: Inpatient  Remains inpatient appropriate because:Unsafe d/c plan, IV treatments appropriate due to intensity of illness or inability to take PO and Inpatient level of care appropriate due to severity of illness   Dispo: The patient is from: Home              Anticipated d/c is to: Home              Patient currently is not medically stable to d/c.   Difficult to place patient No  Consultants:   None; Discussed with GI PA  Procedures: None  Antimicrobials:  Anti-infectives (From admission, onward)   Start     Dose/Rate Route Frequency Ordered Stop   04/26/21 1000  ciprofloxacin (CIPRO) IVPB 400 mg        400 mg 200 mL/hr over 60 Minutes Intravenous Every 12 hours 04/26/21 0146     04/26/21 0600  metroNIDAZOLE (FLAGYL) IVPB 500 mg        500 mg 100 mL/hr over 60 Minutes Intravenous Every 8 hours 04/26/21 0146     04/26/21 0115  metroNIDAZOLE (FLAGYL) IVPB 500 mg        500 mg 100 mL/hr over 60 Minutes Intravenous  Once 04/26/21 0109 04/26/21 0305   04/26/21 0115  ciprofloxacin (CIPRO) IVPB 400 mg        400 mg 200 mL/hr over 60  Minutes Intravenous  Once 04/26/21 0110 04/26/21 0254        Subjective: Seen and examined at bedside and states his abdomen is doing better and not as tender.  States he only had brown bowel movement overnight and was yesterday when he got to the floor and was bloody but not as bad.  No chest pain, shortness of breath.  No lightheadedness or dizziness.  No other concerns or complaints at this time.  Objective: Vitals:   04/26/21 1144 04/26/21 2055 04/27/21 0203 04/27/21 0511  BP: 118/67 135/90 (!) 146/78 115/63  Pulse: (!) 55 68 65 60  Resp: '18 18 18 19  ' Temp: 97.9 F (36.6 C) 98.7 F (37.1 C) 98.2 F (36.8 C) 98.3 F (36.8 C)  TempSrc: Oral Oral Oral Oral  SpO2: 97% 99% 98% 97%  Weight: 100.4 kg   98.9 kg  Height:        Intake/Output Summary (Last 24 hours) at 04/27/2021 1212 Last data filed at 04/27/2021 0500 Gross per 24 hour  Intake 1677.96 ml  Output 2 ml  Net 1675.96 ml   Filed Weights   04/26/21 0055 04/26/21 1144 04/27/21 0511  Weight: 99.8 kg 100.4 kg 98.9 kg   Examination: Physical Exam:  Constitutional: WN/WD overweight Caucasian male in NAD and appears calm Eyes: Lids and conjunctivae normal, sclerae anicteric  ENMT: External Ears, Nose appear normal. Grossly normal hearing. Neck: Appears normal, supple, no cervical masses, normal ROM, no appreciable thyromegaly; no JVD Respiratory: Clear to auscultation bilaterally, no wheezing, rales, rhonchi or crackles. Normal respiratory effort and patient is not tachypenic. No accessory muscle use.  Cardiovascular: RRR, no murmurs / rubs / gallops. S1 and S2 auscultated. No extremity edema.  Abdomen: Soft, Tender to palpate, Distended slightly 2/2 body habitus. Bowel sounds positive.  GU: Deferred. Musculoskeletal: No clubbing / cyanosis of digits/nails. No joint deformity upper and lower extremities.  Skin: No rashes, lesions, ulcers on a limited skin evaluation. No induration; Warm and dry.  Neurologic: CN 2-12  grossly intact with no focal deficits. Romberg sign and cerebellar reflexes not assessed.  Psychiatric: Normal judgment and insight. Alert and oriented x 3. Normal mood and appropriate affect.   Data Reviewed: I have personally reviewed following labs and imaging studies  CBC: Recent Labs  Lab 04/25/21 2237 04/26/21 0527 04/26/21 1228 04/26/21 1955 04/27/21 0709  WBC 12.1* 8.8 8.6 9.0 8.4  NEUTROABS 8.9*  --   --   --   --   HGB 16.4 14.0 13.9 13.9 13.6  HCT 49.1 41.9 41.6 42.0 40.2  MCV 91.3 92.5 91.6 91.9 91.6  PLT 255 204 189 204 132   Basic Metabolic Panel: Recent Labs  Lab 04/25/21 2237 04/26/21 0527 04/27/21 0709  NA 136 133* 135  K 5.6* 4.0 4.6  CL 100 103 101  CO2 '25 24 28  ' GLUCOSE 108* 94 96  BUN '12 12 7  ' CREATININE 1.54* 1.28* 1.47*  CALCIUM 9.9 8.9 8.9   GFR: Estimated Creatinine Clearance: 74.8 mL/min (A) (by C-G formula based on SCr of 1.47 mg/dL (H)). Liver Function Tests: Recent Labs  Lab 04/25/21 2237 04/26/21 0527 04/27/21 0709  AST 51* 29 25  ALT 93* 69* 54*  ALKPHOS 61 49 42  BILITOT 1.5* 0.9 0.8  PROT 8.0 6.4* 6.4*  ALBUMIN 4.4 3.5 3.3*   Recent Labs  Lab 04/25/21 2237  LIPASE 28   No results for input(s): AMMONIA in the last 168 hours. Coagulation Profile: No results for input(s): INR, PROTIME in the last 168 hours. Cardiac Enzymes: No results for input(s): CKTOTAL, CKMB, CKMBINDEX, TROPONINI in the last 168 hours. BNP (last 3 results) No results for input(s): PROBNP in the last 8760 hours. HbA1C: No results for input(s): HGBA1C in the last 72 hours. CBG: No results for input(s): GLUCAP in the last 168 hours. Lipid Profile: No results for input(s): CHOL, HDL, LDLCALC, TRIG, CHOLHDL, LDLDIRECT in the last 72 hours. Thyroid Function Tests: No results for input(s): TSH, T4TOTAL, FREET4, T3FREE, THYROIDAB in the last 72 hours. Anemia Panel: No results for input(s): VITAMINB12, FOLATE, FERRITIN, TIBC, IRON, RETICCTPCT in the last  72 hours. Sepsis Labs: Recent Labs  Lab 04/25/21 2237  LATICACIDVEN 1.6    Recent Results (from the past 240 hour(s))  SARS CORONAVIRUS 2 (TAT 6-24 HRS) Nasopharyngeal Nasopharyngeal Swab  Status: None   Collection Time: 04/26/21  1:45 AM   Specimen: Nasopharyngeal Swab  Result Value Ref Range Status   SARS Coronavirus 2 NEGATIVE NEGATIVE Final    Comment: (NOTE) SARS-CoV-2 target nucleic acids are NOT DETECTED.  The SARS-CoV-2 RNA is generally detectable in upper and lower respiratory specimens during the acute phase of infection. Negative results do not preclude SARS-CoV-2 infection, do not rule out co-infections with other pathogens, and should not be used as the sole basis for treatment or other patient management decisions. Negative results must be combined with clinical observations, patient history, and epidemiological information. The expected result is Negative.  Fact Sheet for Patients: SugarRoll.be  Fact Sheet for Healthcare Providers: https://www.woods-mathews.com/  This test is not yet approved or cleared by the [place] FDA and  has been authorized for detection and/or diagnosis of SARS-CoV-2 by FDA under an Emergency Use Authorization (EUA). This EUA will remain  in effect (meaning this test can be used) for the duration of the COVID-19 declaration under Se ction 564(b)(1) of the Act, 21 U.S.C. section 360bbb-3(b)(1), unless the authorization is terminated or revoked sooner.  Performed at [facility], [place] [address] [place], [place] [address]     RN Pressure Injury Documentation:    Estimated body mass index is 27.99 kg/m as calculated from the following:   Height as of this encounter: '6\' 2"'  (1.88 m).   Weight as of this encounter: 98.9 kg.  Malnutrition Type:   Malnutrition Characteristics:   Nutrition Interventions:   Radiology Studies: CT ABDOMEN PELVIS WO CONTRAST  Result Date:  04/25/2021 CLINICAL DATA:  Hypogastric pain with nausea and diarrhea, no fevers or chills EXAM: CT ABDOMEN AND PELVIS WITHOUT CONTRAST TECHNIQUE: Multidetector CT imaging of the abdomen and pelvis was performed following the standard protocol without IV contrast. COMPARISON:  None. FINDINGS: Lower chest: Lung bases are clear. Normal heart size. No pericardial effusion. Hepatobiliary: Diffuse hepatic hypoattenuation compatible with hepatic steatosis. Sparing along the gallbladder fossa. Smooth surface contour. No concerning focal liver lesion is seen. Gallbladder distention within normal limits. No pericholecystic fluid or inflammation. No visible calcified gallstones. No biliary ductal dilatation or intraductal gallstones. Pancreas: Partial fatty replacement of the pancreas. No pancreatic ductal dilatation or surrounding inflammatory changes. Spleen: Normal in size. No concerning splenic lesions. Adrenals/Urinary Tract: Normal adrenal glands. 3.9 cm fluid attenuation cyst in the upper pole left kidney. No concerning renal lesion is visible. No urolithiasis or hydronephrosis. Urinary bladder is largely decompressed at the time of exam and therefore poorly evaluated by CT imaging. Bladder wall thickening may be related to underdistention. Stomach/Bowel: Distal esophagus, stomach and duodenal sweep are unremarkable. No small bowel wall thickening or dilatation. No evidence of obstruction. Appendix is not visualized. No focal inflammation the vicinity of the cecum to suggest an occult appendicitis. Segmental thickening of the colon from the splenic flexure is through the mid descending colonic segment returning to a more normal caliber just proximal to the rectosigmoid anastomosis in the midline pelvis. This anastomotic site appears widely patent. Scattered colonic diverticula without focal diverticular inflammation to suggest diverticulitis as the underlying etiology. Vascular/Lymphatic: No significant vascular  findings are present. No enlarged abdominal or pelvic lymph nodes. Reproductive: The prostate and seminal vesicles are unremarkable. Other: Some hazy pericolonic stranding, surrounding the thickened segments splenic flexure and descending colon as above. No free air. No organized abscess or collection. No pneumatosis or portal venous gas. No air within the draining mesentery. Musculoskeletal: No acute osseous  abnormality or suspicious osseous lesion. Multilevel degenerative changes are present in the imaged portions of the spine. Mild retrolisthesis L4 on 5 approximately 4 mm. No associated spondylolysis. Discogenic changes are maximal at this level. Additional mild degenerative changes in the bilateral hips and pelvis. 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 No acute or worrisome osseous abnormalities. IMPRESSION: 1. Segmental thickening of the splenic flexure and descending colon, favor acute colitis of infectious or inflammatory etiology. 2. Colonic diverticulosis without focal inflammation to suggest active diverticulitis at this time. 3. Postsurgical changes from prior rectosigmoid anastomosis which appears widely patent without acute complication. Correlate with surgical history. 4. Hepatic steatosis. Electronically Signed   By: Lovena Le M.D.   On: 04/25/2021 23:13   Scheduled Meds: Continuous Infusions: . sodium chloride 100 mL/hr at 04/27/21 1055  . ciprofloxacin Stopped (04/27/21 0100)  . metronidazole 500 mg (04/27/21 3614)    LOS: 1 day   Kerney Elbe, DO [facility] PAGER is on [place]  If 7PM-7AM, please contact night-coverage [email or web address]

## 2021-04-27 NOTE — Plan of Care (Signed)

## 2021-04-28 ENCOUNTER — Encounter (HOSPITAL_COMMUNITY): Payer: Self-pay

## 2021-04-28 LAB — CBC WITH DIFFERENTIAL/PLATELET
Abs Immature Granulocytes: 0.03 10*3/uL (ref 0.00–0.07)
Basophils Absolute: 0 10*3/uL (ref 0.0–0.1)
Basophils Relative: 0 %
Eosinophils Absolute: 0.1 10*3/uL (ref 0.0–0.5)
Eosinophils Relative: 2 %
HCT: 38.8 % — ABNORMAL LOW (ref 39.0–52.0)
Hemoglobin: 13.2 g/dL (ref 13.0–17.0)
Immature Granulocytes: 0 %
Lymphocytes Relative: 24 %
Lymphs Abs: 1.7 10*3/uL (ref 0.7–4.0)
MCH: 31.2 pg (ref 26.0–34.0)
MCHC: 34 g/dL (ref 30.0–36.0)
MCV: 91.7 fL (ref 80.0–100.0)
Monocytes Absolute: 0.8 10*3/uL (ref 0.1–1.0)
Monocytes Relative: 11 %
Neutro Abs: 4.2 10*3/uL (ref 1.7–7.7)
Neutrophils Relative %: 63 %
Platelets: 201 10*3/uL (ref 150–400)
RBC: 4.23 MIL/uL (ref 4.22–5.81)
RDW: 13 % (ref 11.5–15.5)
WBC: 6.9 10*3/uL (ref 4.0–10.5)
nRBC: 0 % (ref 0.0–0.2)

## 2021-04-28 LAB — COMPREHENSIVE METABOLIC PANEL
ALT: 49 U/L — ABNORMAL HIGH (ref 0–44)
AST: 28 U/L (ref 15–41)
Albumin: 3.2 g/dL — ABNORMAL LOW (ref 3.5–5.0)
Alkaline Phosphatase: 42 U/L (ref 38–126)
Anion gap: 8 (ref 5–15)
BUN: 7 mg/dL (ref 6–20)
CO2: 23 mmol/L (ref 22–32)
Calcium: 8.6 mg/dL — ABNORMAL LOW (ref 8.9–10.3)
Chloride: 105 mmol/L (ref 98–111)
Creatinine, Ser: 1.26 mg/dL — ABNORMAL HIGH (ref 0.61–1.24)
GFR, Estimated: >60 mL/min (ref >60)
Glucose, Bld: 89 mg/dL (ref 70–99)
Potassium: 4.2 mmol/L (ref 3.5–5.1)
Sodium: 136 mmol/L (ref 135–145)
Total Bilirubin: 0.7 mg/dL (ref 0.3–1.2)
Total Protein: 6.2 g/dL — ABNORMAL LOW (ref 6.5–8.1)

## 2021-04-28 LAB — PHOSPHORUS: Phosphorus: 3.8 mg/dL (ref 2.5–4.6)

## 2021-04-28 LAB — MAGNESIUM: Magnesium: 1.9 mg/dL (ref 1.7–2.4)

## 2021-04-28 MED ORDER — METRONIDAZOLE 500 MG PO TABS: 500.0000 mg | ORAL_TABLET | Freq: Three times a day (TID) | ORAL | 0 refills | Status: AC

## 2021-04-28 MED ORDER — ONDANSETRON HCL 4 MG PO TABS
4.0000 mg | ORAL_TABLET | Freq: Four times a day (QID) | ORAL | 0 refills | Status: AC | PRN
Start: 2021-04-28 — End: ?

## 2021-04-28 MED ORDER — ACETAMINOPHEN 325 MG PO TABS
650.0000 mg | ORAL_TABLET | Freq: Four times a day (QID) | ORAL | 0 refills | Status: AC | PRN
Start: 2021-04-28 — End: ?

## 2021-04-28 MED ORDER — CIPROFLOXACIN HCL 500 MG PO TABS: 500.0000 mg | ORAL_TABLET | Freq: Two times a day (BID) | ORAL | 0 refills | Status: AC

## 2021-04-28 NOTE — Progress Notes (Unsigned)
[facility] to be D/C'd per MD order. Discussed with the patient and all questions fully answered. ? VSS, Skin clean, dry and intact without evidence of skin break down, no evidence of skin tears noted. ? IV catheter discontinued intact. Site without signs and symptoms of complications. Dressing and pressure applied. ? An After Visit Summary was printed and given to the patient. Patient informed where to pickup prescriptions. ? D/c education completed with patient/family including follow up instructions, medication list, d/c activities limitations if indicated, with other d/c instructions as indicated by MD - patient able to verbalize understanding, all questions fully answered.  ? Patient instructed to return to ED, call 911, or call MD for any changes in condition.  ? Patient to be escorted via WC, and D/C home via private auto.

## 2021-04-28 NOTE — Progress Notes (Signed)
[facility] to be D/C'd per MD order. Discussed with the patient and all questions fully answered. ? VSS, Skin clean, dry and intact without evidence of skin break down, no evidence of skin tears noted. ? IV catheter discontinued intact. Site without signs and symptoms of complications. Dressing and pressure applied. ? An After Visit Summary was printed and given to the patient. Patient informed where to pickup prescriptions. ? D/c education completed with patient/family including follow up instructions, medication list, d/c activities limitations if indicated, with other d/c instructions as indicated by MD - patient able to verbalize understanding, all questions fully answered.  ? Patient instructed to return to ED, call 911, or call MD for any changes in condition.  ? Patient to be escorted home via private auto.

## 2021-04-28 NOTE — Discharge Summary (Signed)
Physician Discharge Summary  Nicholas Ayers BOF:[id number] DOB: Dec 28, 1970 DOA: 04/25/2021  [facility]  Admit date: 04/25/2021 Discharge date: 04/28/2021  Admitted From: Home Disposition: Home  Recommendations for Outpatient Follow-up:  1. Follow up with PCP in 1-2 weeks 2. Follow up with Gastroenterology as an outpatient setting; Referral made to [facility] Dr. Ardis Hughs 3. Please obtain CMP/CBC, Mag, Phos in one week 4. 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 Patient states that he was in his normal state of health until he woke up with a HIDA 04/24/2021 severe pain in left side of his abdomen. This was associated with nausea but no vomiting, roughly 5 stools that contained some blood that was dark red. He also had diaphoresis associated with this and he attributed this to pain. He reported in 2019 when he had a fistula that he was on impression that this was a secondary to chemical exposure while in [employer] and that he would not have any further issues after his resection. He presented to the ED and was found to be afebrile saturating well on room air with a blood pressure 155/102. He did have a leukocytosis of 12,000 and urinalysis with ketonuria. CT of the abdomen pelvis was done and showed segmental thickening of the splenic flexure and descending colon, hepatic steatosis, and no hydronephrosis. In the ED he was given a liter of LR, ciprofloxacin and Flagyl,  morphine, fentanyl and Zofran.  **Interim History Had a bloody bowel movement the day before yesterday however thinks it is improving and had no further bowel movement since. Abdominal pain is still there but is improving and much improved.  He remains on antibiotics and IV fluid hydration and diet has been advanced to a full liquid diet and will anticipate going to soft tomorrow.  Patient tolerated soft diet without issues.  No other concerns or complaints this time and he was deemed medically stable to be discharged at this time and follow-up with PCP as well as gastroenterology in outpatient setting.  Discharge Diagnoses:  Principal Problem:   Acute hemorrhagic colitis Active Problems:   Renal insufficiency   Elevated LFTs   Hyperkalemia  AcuteColitisand likely Ischemic Colitis -Presents with 1 day of severe left-sided abdominal pain with bloody diarrhea and is found on CT to have thickening of splenic flexure and descending colon concerning for infectious or inflammatory colitis  -Started on antibiotics in EDwith Cipro Flagyl and will continue for now -Check GI pathogen panel but had no further Bowel movements so cancelled. -Had ESR, and CRP,type and screen and follow serial H&H, -Continue withpain-controlwith IV Hydromorphone 0.5 to 1 mg IV every 4 as needed for moderate to severe pain and acetaminophen 650 mg p.o. every 6 as needed for mild pain and fever,andIVF hydrationas below -WBC went from 12.1 -> 8.8 -> 8.6 -> 9.0 -> 8.4 -> 7.2 -> 6.9 -CRP was 5.7 and ESR was 4 -CLD advanced to FULL Liquid and will go to Soft in the AM  -Continue with antiemetics with ondansetron 4 mg p.o./IV every 6 as needed -We  will discuss with GI about possible further evaluation and work-up and they feel no need to see the patient currently at this point and they feel that his symptoms are consistent with ischemic colitis and that if he is not better by Sunday they will be happy to formally  consult -Continue with supportive care and since he improved we will change to p.o. antibiotics and refer to GI in outpatient setting  AKI, improving  Hyperkalemia, improved -SCr is 1.54 and potassium 5.6 on admission -Patient denies hx of CKD -No hydronephrosis on CT -Likely prerenal azotemia in setting of loose stools -Patient's BUN/Cr went from 12/1.54 -> 12/1.28 -> 7/1.47 -> 7/1.26 -Urinalysis done and showed a hazy appearance with small hemoglobin, 80 ketones, negative leukocytes, negative nitrites, no bacteria seen, 0-5 squamous epithelial cells, 0-5 RBCs per high-power field, as well as 0-5 WBCs -Avoid nephrotoxic medications, contrast dyes, hypotension and renally adjust medications -Continue IVF hydration125 mL/h for 10 hours and then reduce rate to 75 MLS per hour but will will increase back to 100 mL/hr and change to NS  -RepeatCMP in the AM  Hyponatremia -Mild -Patient's sodium went from 136 -> 133 -> 135 -> 136 -Continue with IV fluid hydration with lactated Ringer's at 125 mils per hourbeing reduced to 75 mL/hrbut have now changed to NS at 100 mL/hr -Repeat CMP in a.m.  Abnormal LFT's/Elevated LFTs, improving Hyperbilirubinemia, improved -Mild elevation in transaminases and total bilirubin noted on admission without RUQ pain -Hepatic steatosis on CT in ED -T Bili is now 0.7 -AST has improved from 51 -> 29 -> 25 -> 28 and ALT has improved from 93 -> 69 -> 54 -> 49 -Continue to Monitor and Trend and if Necessary will need to obtain RUQ U/S and Acute Hepatitis Panel -Continue to Monitor and Trend Hepatic Fxn Panel carefully and repeat CMP as an outpatient   Discharge Instructions  Discharge Instructions    Call MD for:  difficulty breathing, headache or visual disturbances   Complete by: As directed    Call MD for:  extreme fatigue   Complete by: As directed    Call MD for:  hives   Complete by: As directed    Call MD for:  persistant dizziness or  light-headedness   Complete by: As directed    Call MD for:  persistant nausea and vomiting   Complete by: As directed    Call MD for:  redness, tenderness, or signs of infection (pain, swelling, redness, odor or green/yellow discharge around incision site)   Complete by: As directed    Call MD for:  severe uncontrolled pain   Complete by: As directed    Call MD for:  temperature >100.4   Complete by: As directed    Diet - low sodium heart healthy   Complete by: As directed    Discharge instructions   Complete by: As directed    You were cared for by a hospitalist during your hospital stay. If you have any questions about your discharge medications or the care you received while you were in the hospital after you are discharged, you can call the unit and ask to speak with the hospitalist on call if the hospitalist that took care of you is not available. Once you are discharged, your primary care physician will handle any further medical issues. Please note that NO REFILLS for any discharge medications will be authorized once you are discharged, as it is imperative that you return to your primary care  physician (or establish a relationship with a primary care physician if you do not have one) for your aftercare needs so that they can reassess your need for medications and monitor your lab values.  Follow up with PCP and Gastroenterology as an outpatient within 1-2 weeks. Take all medications as prescribed. If symptoms change or worsen please return to the ED for evaluation   Increase activity slowly   Complete by: As directed      Allergies as of 04/28/2021   No Known Allergies     Medication List    TAKE these medications   acetaminophen 325 MG tablet Commonly known as: TYLENOL Take 2 tablets (650 mg total) by mouth every 6 (six) hours as needed for mild pain (or Fever >/= 101).   BC HEADACHE POWDER PO Take 1 packet by mouth 2 (two) times daily as needed (headaches, mild pain).    buPROPion 150 MG 12 hr tablet Commonly known as: WELLBUTRIN SR Take 150 mg by mouth 2 (two) times daily.   ciprofloxacin 500 MG tablet Commonly known as: Cipro Take 1 tablet (500 mg total) by mouth 2 (two) times daily for 7 days.   metroNIDAZOLE 500 MG tablet Commonly known as: Flagyl Take 1 tablet (500 mg total) by mouth 3 (three) times daily for 7 days.   mirtazapine 30 MG tablet Commonly known as: REMERON Take 15-30 mg by mouth See admin instructions. Take one-half tablet at bedtime for one week then take one tablet at bedtime.   multivitamin with minerals Tabs tablet Take 1 tablet by mouth daily.   naloxone 4 MG/0.1ML Liqd nasal spray kit Commonly known as: NARCAN Place 1 spray into the nose as needed (overdose).   omeprazole 20 MG capsule Commonly known as: PRILOSEC Take 20 mg by mouth daily as needed (acid reflux).   ondansetron 4 MG tablet Commonly known as: ZOFRAN Take 1 tablet (4 mg total) by mouth every 6 (six) hours as needed for nausea.   oxybutynin 5 MG 24 hr tablet Commonly known as: DITROPAN-XL Take 5 mg by mouth at bedtime as needed (unrinary incontinence).   oxyCODONE 5 MG immediate release tablet Commonly known as: Oxy IR/ROXICODONE Take 5 mg by mouth 2 (two) times daily as needed for severe pain.   PREVAGEN PO Take 1 tablet by mouth daily.       Follow-up Information    [facility] Call.   Contact information: [address] [place] [address] [phone]        Milus Banister, MD. Call.   Specialty: Gastroenterology Contact information: [address] [place] [address] [phone]              No Known Allergies  Consultations:  None; Discussed with GI PA  Procedures/Studies: CT ABDOMEN PELVIS WO CONTRAST  Result Date: 04/25/2021 CLINICAL DATA:  Hypogastric pain with nausea and diarrhea, no fevers or chills EXAM: CT ABDOMEN AND PELVIS WITHOUT CONTRAST TECHNIQUE: Multidetector CT imaging of the  abdomen and pelvis was performed following the standard protocol without IV contrast. COMPARISON:  None. FINDINGS: Lower chest: Lung bases are clear. Normal heart size. No pericardial effusion. Hepatobiliary: Diffuse hepatic hypoattenuation compatible with hepatic steatosis. Sparing along the gallbladder fossa. Smooth surface contour. No concerning focal liver lesion is seen. Gallbladder distention within normal limits. No pericholecystic fluid or inflammation. No visible calcified gallstones. No biliary ductal dilatation or intraductal gallstones. Pancreas: Partial fatty replacement of the pancreas. No pancreatic ductal dilatation or surrounding inflammatory changes. Spleen: Normal  in size. No concerning splenic lesions. Adrenals/Urinary Tract: Normal adrenal glands. 3.9 cm fluid attenuation cyst in the upper pole left kidney. No concerning renal lesion is visible. No urolithiasis or hydronephrosis. Urinary bladder is largely decompressed at the time of exam and therefore poorly evaluated by CT imaging. Bladder wall thickening may be related to underdistention. Stomach/Bowel: Distal esophagus, stomach and duodenal sweep are unremarkable. No small bowel wall thickening or dilatation. No evidence of obstruction. Appendix is not visualized. No focal inflammation the vicinity of the cecum to suggest an occult appendicitis. Segmental thickening of the colon from the splenic flexure is through the mid descending colonic segment returning to a more normal caliber just proximal to the rectosigmoid anastomosis in the midline pelvis. This anastomotic site appears widely patent. Scattered colonic diverticula without focal diverticular inflammation to suggest diverticulitis as the underlying etiology. Vascular/Lymphatic: No significant vascular findings are present. No enlarged abdominal or pelvic lymph nodes. Reproductive: The prostate and seminal vesicles are unremarkable. Other: Some hazy pericolonic stranding, surrounding  the thickened segments splenic flexure and descending colon as above. No free air. No organized abscess or collection. No pneumatosis or portal venous gas. No air within the draining mesentery. Musculoskeletal: No acute osseous abnormality or suspicious osseous lesion. Multilevel degenerative changes are present in the imaged portions of the spine. Mild retrolisthesis L4 on 5 approximately 4 mm. No associated spondylolysis. Discogenic changes are maximal at this level. Additional mild degenerative changes in the bilateral hips and pelvis. 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 No acute or worrisome osseous abnormalities. IMPRESSION: 1. Segmental thickening of the splenic flexure and descending colon, favor acute colitis of infectious or inflammatory etiology. 2. Colonic diverticulosis without focal inflammation to suggest active diverticulitis at this time. 3. Postsurgical changes from prior rectosigmoid anastomosis which appears widely patent without acute complication. Correlate with surgical history. 4. Hepatic steatosis. Electronically Signed   By: Lovena Le M.D.   On: 04/25/2021 23:13    Subjective: Seen and examined at bedside the patient was doing much better and had minimal abdominal pain.  Had no further bowel movements.  Tolerated soft diet without issues.  Stable for discharge and will need to follow-up with PCP as well as gastroenterology in outpatient setting.  Discharge Exam: Vitals:   04/27/21 2215 04/28/21 0648  BP: (!) 141/79 136/86  Pulse: 69 65  Resp: 18 18  Temp: 98.5 F (36.9 C) 98.2 F (36.8 C)  SpO2: 98% 99%   Vitals:   04/27/21 0511 04/27/21 1427 04/27/21 2215 04/28/21 0648  BP: 115/63 117/83 (!) 141/79 136/86  Pulse: 60 63 69 65  Resp: _0 Temp: 98.3 F (36.8 C) 98.3 F (36.8 C) 98.5 F (36.9 C) 98.2 F (36.8 C)  TempSrc: Oral Oral Oral Oral  SpO2: 97% 99% 98% 99%  Weight: 98.9 kg   100.2 kg  Height:       General: Pt is alert, awake, not in acute  distress Cardiovascular: RRR, S1/S2 +, no rubs, no gallops Respiratory: CTA bilaterally, no wheezing, no rhonchi Abdominal: Soft, mildly tender, slightly distended, bowel sounds + Extremities: no appreciable edema, no cyanosis  The results of significant diagnostics from this hospitalization (including imaging, microbiology, ancillary and laboratory) are listed below for reference.    Microbiology: Recent Results (from the past 240 hour(s))  SARS CORONAVIRUS 2 (TAT 6-24 HRS) Nasopharyngeal Nasopharyngeal Swab     Status: None   Collection Time: 04/26/21  1:45 AM   Specimen: Nasopharyngeal Swab  Result Value Ref Range Status   SARS Coronavirus 2 NEGATIVE NEGATIVE Final    Comment: (NOTE) SARS-CoV-2 target nucleic acids are NOT DETECTED.  The SARS-CoV-2 RNA is generally detectable in upper and lower respiratory specimens during the acute phase of infection. Negative results do not preclude SARS-CoV-2 infection, do not rule out co-infections with other pathogens, and should not be used as the sole basis for treatment or other patient management decisions. Negative results must be combined with clinical observations, patient history, and epidemiological information. The expected result is Negative.  Fact Sheet for Patients: SugarRoll.be  Fact Sheet for Healthcare Providers: https://www.woods-mathews.com/  This test is not yet approved or cleared by the [place] FDA and  has been authorized for detection and/or diagnosis of SARS-CoV-2 by FDA under an Emergency Use Authorization (EUA). This EUA will remain  in effect (meaning this test can be used) for the duration of the COVID-19 declaration under Se ction 564(b)(1) of the Act, 21 U.S.C. section 360bbb-3(b)(1), unless the authorization is terminated or revoked sooner.  Performed at [facility], [place] [address], [place], [place] [address]    Labs: BNP (last 3 results) No  results for input(s): BNP in the last 8760 hours. Basic Metabolic Panel: Recent Labs  Lab 04/25/21 2237 04/26/21 0527 04/27/21 0709 04/28/21 0705  NA 136 133* 135 136  K 5.6* 4.0 4.6 4.2  CL 100 103 101 105  CO2 _0 GLUCOSE 108* 94 96 89  BUN _1 CREATININE 1.54* 1.28* 1.47* 1.26*  CALCIUM 9.9 8.9 8.9 8.6*  MG  --   --   --  1.9  PHOS  --   --   --  3.8   Liver Function Tests: Recent Labs  Lab 04/25/21 2237 04/26/21 0527 04/27/21 0709 04/28/21 0705  AST 51* _2 ALT 93* 69* 54* 49*  ALKPHOS 61 49 42 42  BILITOT 1.5* 0.9 0.8 0.7  PROT 8.0 6.4* 6.4* 6.2*  ALBUMIN 4.4 3.5 3.3* 3.2*   Recent Labs  Lab 04/25/21 2237  LIPASE 28   No results for input(s): AMMONIA in the last 168 hours. CBC: Recent Labs  Lab 04/25/21 2237 04/26/21 0527 04/26/21 1228 04/26/21 1955 04/27/21 0709 04/27/21 1246 04/28/21 0705  WBC 12.1*   < > 8.6 9.0 8.4 7.2 6.9  NEUTROABS 8.9*  --   --   --   --   --  4.2  HGB 16.4   < > 13.9 13.9 13.6 13.3 13.2  HCT 49.1   < > 41.6 42.0 40.2 39.8 38.8*  MCV 91.3   < > 91.6 91.9 91.6 90.7 91.7  PLT 255   < > 189 204 206 201 201   < > = values in this interval not displayed.   Cardiac Enzymes: No results for input(s): CKTOTAL, CKMB, CKMBINDEX, TROPONINI in the last 168 hours. BNP: Invalid input(s): POCBNP CBG: No results for input(s): GLUCAP in the last 168 hours. D-Dimer No results for input(s): DDIMER in the last 72 hours. Hgb A1c No results for input(s): HGBA1C in the last 72 hours. Lipid Profile No results for input(s): CHOL, HDL, LDLCALC, TRIG, CHOLHDL, LDLDIRECT in the last 72 hours. Thyroid function studies No results for input(s): TSH, T4TOTAL, T3FREE, THYROIDAB in the last 72 hours.  Invalid input(s): FREET3 Anemia work up No results for input(s): VITAMINB12, FOLATE, FERRITIN, TIBC, IRON, RETICCTPCT in the last 72 hours. Urinalysis    Component Value Date/Time  COLORURINE YELLOW 04/25/2021 2251    APPEARANCEUR HAZY (A) 04/25/2021 2251   LABSPEC 1.021 04/25/2021 2251   PHURINE 5.0 04/25/2021 2251   GLUCOSEU NEGATIVE 04/25/2021 2251   HGBUR SMALL (A) 04/25/2021 2251   BILIRUBINUR NEGATIVE 04/25/2021 2251   KETONESUR 80 (A) 04/25/2021 2251   PROTEINUR NEGATIVE 04/25/2021 2251   NITRITE NEGATIVE 04/25/2021 2251   LEUKOCYTESUR NEGATIVE 04/25/2021 2251   Sepsis Labs Invalid input(s): PROCALCITONIN,  WBC,  LACTICIDVEN Microbiology Recent Results (from the past 240 hour(s))  SARS CORONAVIRUS 2 (TAT 6-24 HRS) Nasopharyngeal Nasopharyngeal Swab     Status: None   Collection Time: 04/26/21  1:45 AM   Specimen: Nasopharyngeal Swab  Result Value Ref Range Status   SARS Coronavirus 2 NEGATIVE NEGATIVE Final    Comment: (NOTE) SARS-CoV-2 target nucleic acids are NOT DETECTED.  The SARS-CoV-2 RNA is generally detectable in upper and lower respiratory specimens during the acute phase of infection. Negative results do not preclude SARS-CoV-2 infection, do not rule out co-infections with other pathogens, and should not be used as the sole basis for treatment or other patient management decisions. Negative results must be combined with clinical observations, patient history, and epidemiological information. The expected result is Negative.  Fact Sheet for Patients: SugarRoll.be  Fact Sheet for Healthcare Providers: https://www.woods-mathews.com/  This test is not yet approved or cleared by the [place] FDA and  has been authorized for detection and/or diagnosis of SARS-CoV-2 by FDA under an Emergency Use Authorization (EUA). This EUA will remain  in effect (meaning this test can be used) for the duration of the COVID-19 declaration under Se ction 564(b)(1) of the Act, 21 U.S.C. section 360bbb-3(b)(1), unless the authorization is terminated or revoked sooner.  Performed at [facility], [place] [address] [place], [place] [address]     Time coordinating discharge: 35 minutes  SIGNED:  Kerney Elbe, DO [facility] 04/28/2021, 12:14 PM Pager is on [place]  If 7PM-7AM, please contact night-coverage [email or web address]

## 2022-09-06 IMAGING — CT CT ABD-PELV W/O CM
2 of 4 series · 15 of 46 positions shown, 17 images · non-contrast
Comparison: None.

CLINICAL DATA: Hypogastric pain with nausea and diarrhea, no fevers
or chills

EXAM:
CT ABDOMEN AND PELVIS WITHOUT CONTRAST
TECHNIQUE: Multidetector CT imaging of the abdomen and pelvis was performed
following the standard protocol without IV contrast.

[Series 3: ap without · axial · non-contrast · 0.70mm/px · z∈[-414,-29]mm · 12 of 87 slices shown, 14 images]
[im 5/87  soft-tissue]
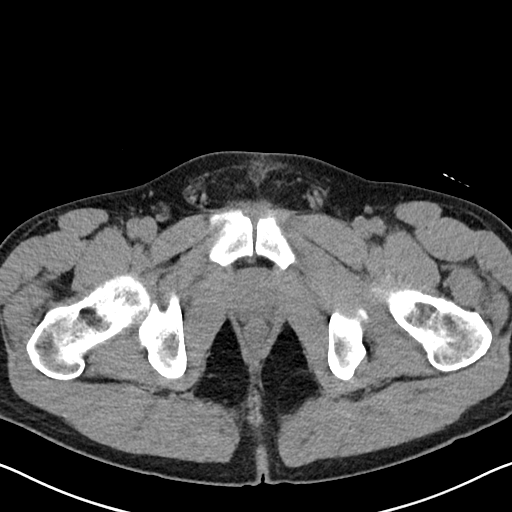
[im 5/87  bone]
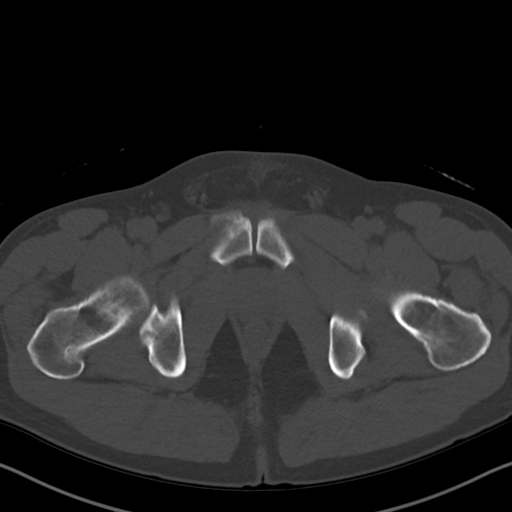
[im 13/87  soft-tissue]
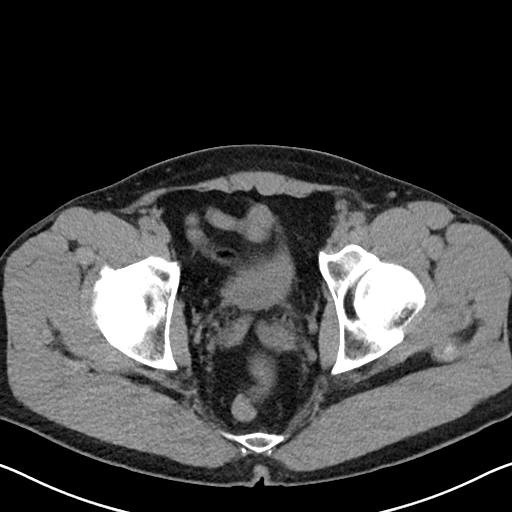
[im 18/87  soft-tissue]
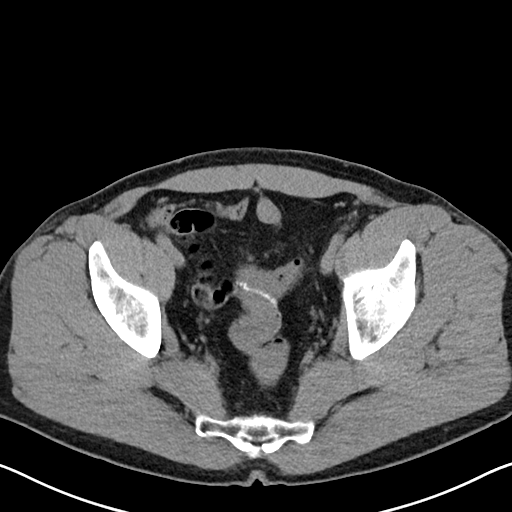
[im 26/87  soft-tissue]
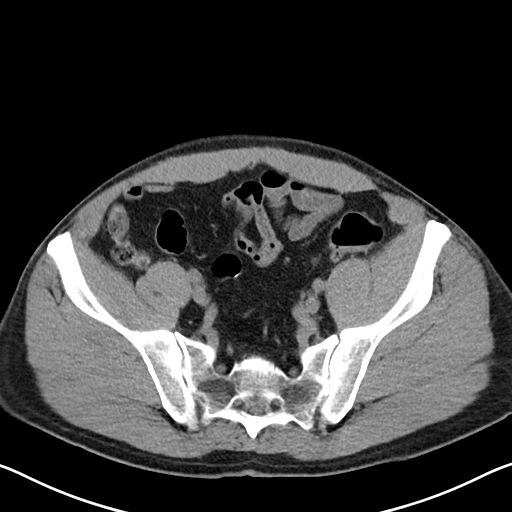
[im 35/87  soft-tissue]
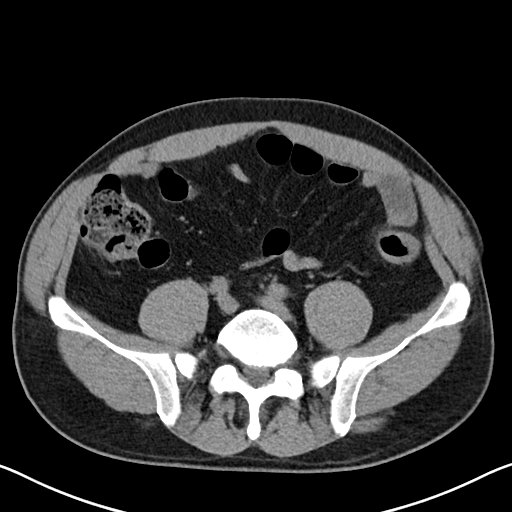
[im 39/87  soft-tissue]
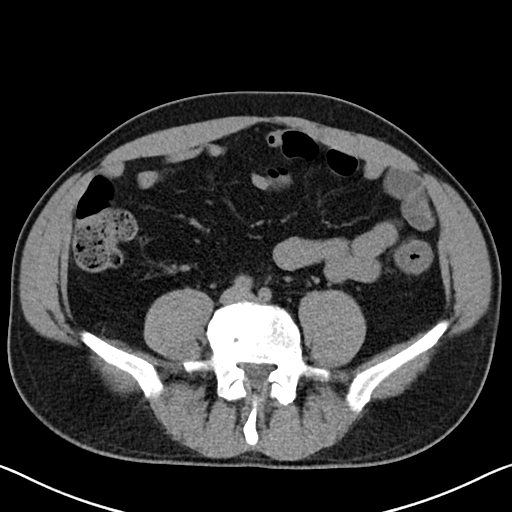
[im 48/87  soft-tissue]
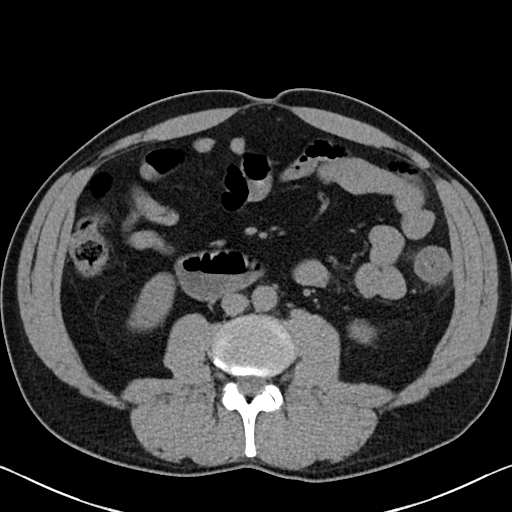
[im 52/87  soft-tissue]
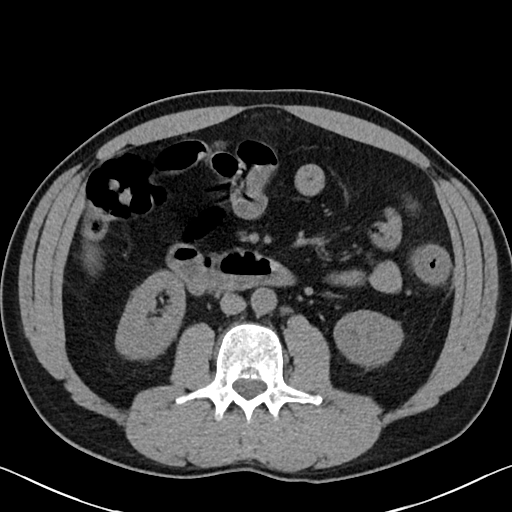
[im 61/87  soft-tissue]
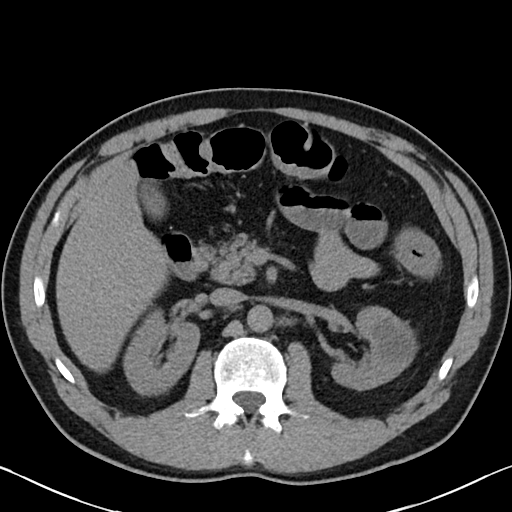
[im 61/87  bone]
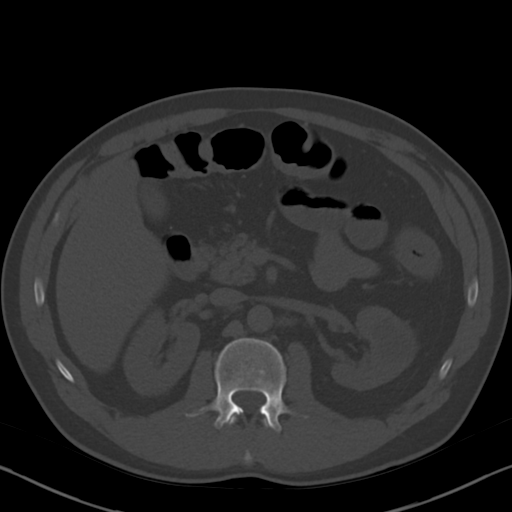
[im 69/87  soft-tissue]
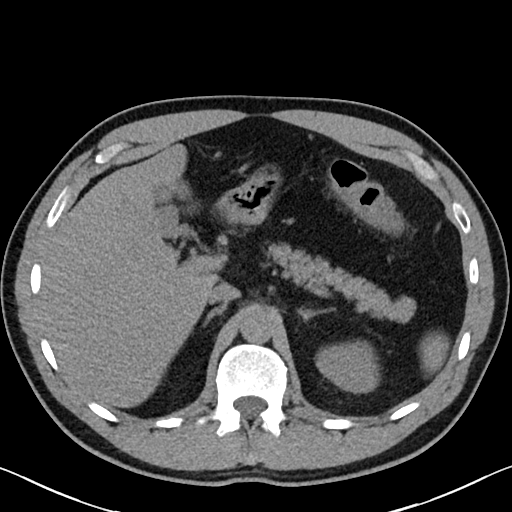
[im 74/87  soft-tissue]
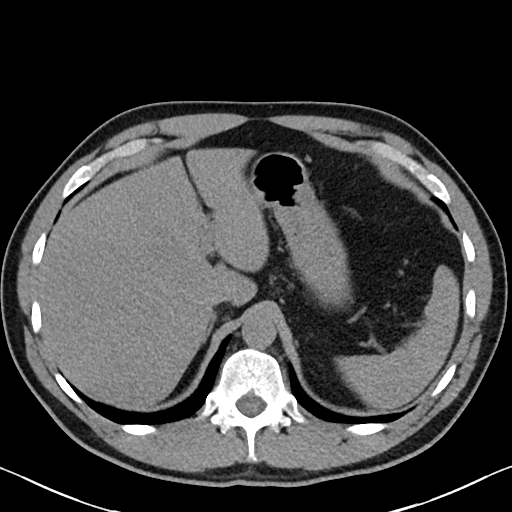
[im 82/87  soft-tissue]
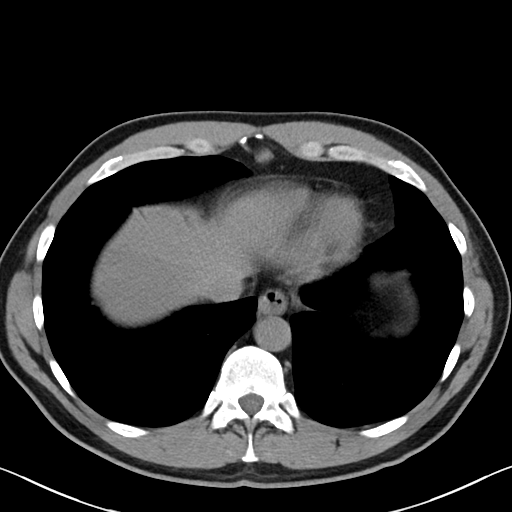

[Series 5: cor · coronal · 0.80mm/px · 3 of 96 slices shown]
[im 32/96  soft-tissue]
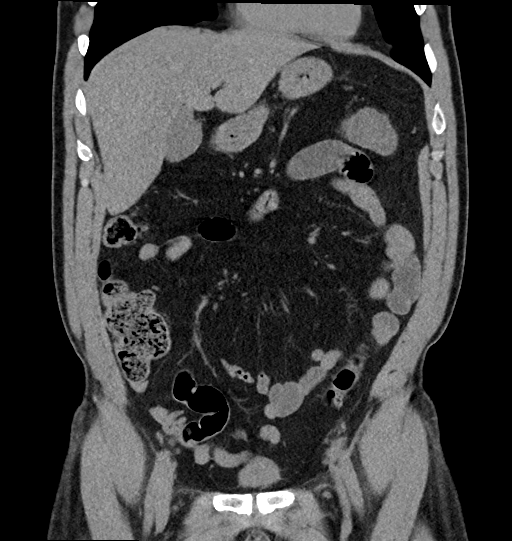
[im 43/96  soft-tissue]
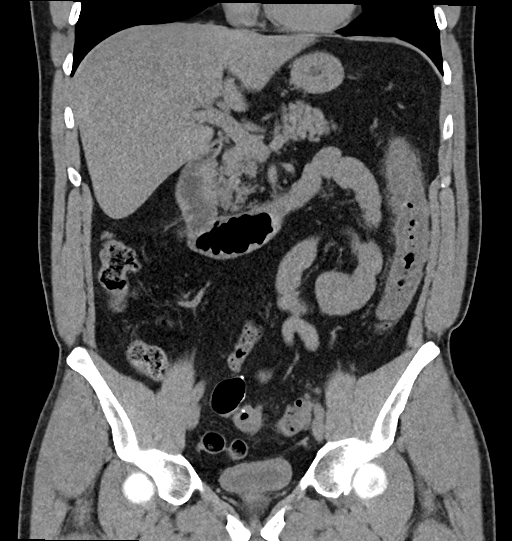
[im 53/96  soft-tissue]
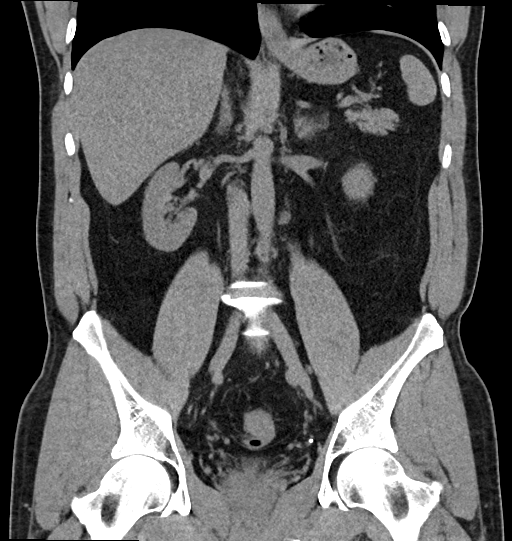

[15 of 46 positions shown; findings below may reference images not displayed]

FINDINGS: Lower chest: Lung bases are clear. Normal heart size. No pericardial
effusion.

Hepatobiliary: Diffuse hepatic hypoattenuation compatible with
hepatic steatosis. Sparing along the gallbladder fossa. Smooth
surface contour. No concerning focal liver lesion is seen.
Gallbladder distention within normal limits. No pericholecystic
fluid or inflammation. No visible calcified gallstones. No biliary
ductal dilatation or intraductal gallstones.

Pancreas: Partial fatty replacement of the pancreas. No pancreatic
ductal dilatation or surrounding inflammatory changes.

Spleen: Normal in size. No concerning splenic lesions.

Adrenals/Urinary Tract: Normal adrenal glands. 3.9 cm fluid
attenuation cyst in the upper pole left kidney. No concerning renal
lesion is visible. No urolithiasis or hydronephrosis. Urinary
bladder is largely decompressed at the time of exam and therefore
poorly evaluated by CT imaging. Bladder wall thickening may be
related to underdistention.

Stomach/Bowel: Distal esophagus, stomach and duodenal sweep are
unremarkable. No small bowel wall thickening or dilatation. No
evidence of obstruction. Appendix is not visualized. No focal
inflammation the vicinity of the cecum to suggest an occult
appendicitis. Segmental thickening of the colon from the splenic
flexure is through the mid descending colonic segment returning to a
more normal caliber just proximal to the rectosigmoid anastomosis in
the midline pelvis. This anastomotic site appears widely patent.
Scattered colonic diverticula without focal diverticular
inflammation to suggest diverticulitis as the underlying etiology.

Vascular/Lymphatic: No significant vascular findings are present. No
enlarged abdominal or pelvic lymph nodes.

Reproductive: The prostate and seminal vesicles are unremarkable.

Other: Some hazy pericolonic stranding, surrounding the thickened
segments splenic flexure and descending colon as above. No free air.
No organized abscess or collection. No pneumatosis or portal venous
gas. No air within the draining mesentery.

Musculoskeletal: No acute osseous abnormality or suspicious osseous
lesion. Multilevel degenerative changes are present in the imaged
portions of the spine. Mild retrolisthesis L4 on 5 approximately 4
mm. No associated spondylolysis. Discogenic changes are maximal at
this level. Additional mild degenerative changes in the bilateral
hips and pelvis. Majumdar pit in the left femoral neck. No acute or
worrisome osseous abnormalities.
IMPRESSION: 1. Segmental thickening of the splenic flexure and descending colon,
favor acute colitis of infectious or inflammatory etiology.
2. Colonic diverticulosis without focal inflammation to suggest
active diverticulitis at this time.
3. Postsurgical changes from prior rectosigmoid anastomosis which
appears widely patent without acute complication. Correlate with
surgical history.
4. Hepatic steatosis.
